# Patient Record
Sex: Female | Born: 1995 | Race: White | Hispanic: No | Marital: Single | Smoking: Never smoker
Health system: Southern US, Community
[De-identification: ages and names within clinical notes are randomized; demographics above are authoritative.]

## PROBLEM LIST (undated history)

## (undated) HISTORY — PX: HERNIA REPAIR: SHX51

---

## 2018-01-25 ENCOUNTER — Ambulatory Visit (HOSPITAL_COMMUNITY)
Admission: EM | Admit: 2018-01-25 | Discharge: 2018-01-25 | Disposition: A | Discharge status: Home / Self Care | Attending: Family Medicine | Admitting: Family Medicine

## 2018-01-25 ENCOUNTER — Encounter (HOSPITAL_COMMUNITY): Payer: Self-pay | Admitting: Emergency Medicine

## 2018-01-25 DIAGNOSIS — R0981 Nasal congestion: Secondary | ICD-10-CM | POA: Diagnosis present

## 2018-01-25 DIAGNOSIS — J029 Acute pharyngitis, unspecified: Secondary | ICD-10-CM | POA: Diagnosis present

## 2018-01-25 DIAGNOSIS — R05 Cough: Secondary | ICD-10-CM | POA: Diagnosis present

## 2018-01-25 DIAGNOSIS — R6889 Other general symptoms and signs: Secondary | ICD-10-CM | POA: Diagnosis not present

## 2018-01-25 DIAGNOSIS — J020 Streptococcal pharyngitis: Secondary | ICD-10-CM | POA: Diagnosis not present

## 2018-01-25 LAB — POCT RAPID STREP A: Streptococcus, Group A Screen (Direct): NEGATIVE

## 2018-01-25 MED ORDER — IPRATROPIUM BROMIDE 0.06 % NA SOLN: 2.0000 | Freq: Four times a day (QID) | NASAL | 0 refills | Status: AC

## 2018-01-25 MED ORDER — FLUTICASONE PROPIONATE 50 MCG/ACT NA SUSP: 2.0000 | Freq: Every day | NASAL | 0 refills | Status: AC

## 2018-01-25 MED ORDER — BENZONATATE 100 MG PO CAPS: 100.0000 mg | ORAL_CAPSULE | Freq: Three times a day (TID) | ORAL | 0 refills | Status: AC

## 2018-01-25 MED ORDER — ACETAMINOPHEN 325 MG PO TABS
ORAL_TABLET | ORAL | Status: AC
  Filled 2018-01-25: qty 2

## 2018-01-25 MED ORDER — ONDANSETRON 4 MG PO TBDP: 4.0000 mg | ORAL_TABLET | Freq: Three times a day (TID) | ORAL | 0 refills | Status: AC | PRN

## 2018-01-25 MED ORDER — ACETAMINOPHEN 325 MG PO TABS
650.0000 mg | ORAL_TABLET | Freq: Once | ORAL | Status: AC
  Administered 2018-01-25: 650 mg via ORAL

## 2018-01-25 NOTE — ED Triage Notes (Signed)
PT reports cough, congestion, sore throat, fever, headaches, and body aches. PT reports nausea and diarrhea as well. Everything started Sudn[place].

## 2018-01-25 NOTE — ED Provider Notes (Signed)
[facility]    CSN: [id number] Arrival date & time: 01/25/18  1646     History   Chief Complaint Chief Complaint  Patient presents with  . Influenza    HPI Sara Solis is a 22 y.o. female.   22 year old female comes in with 3-day history of flulike symptoms.  Has a cough, rhinorrhea, nasal congestion, sore throat, headache, body ache.  T-max 102, Tylenol/NyQuil, last dose last night.  Has had nausea without vomiting.  Has had 5 episodes of loose stools without blood.  OTC lozenges without relief.  Never smoker.  No sick contact.  No flu shot.      History reviewed. No pertinent past medical history.  There are no active problems to display for this patient.   Past Surgical History:  Procedure Laterality Date  . HERNIA REPAIR      OB History    No data available       Home Medications    Prior to Admission medications   Medication Sig Start Date End Date Taking? Authorizing Provider  UNKNOWN TO PATIENT    Yes [provider]  benzonatate (TESSALON) 100 MG capsule Take 1 capsule (100 mg total) by mouth every 8 (eight) hours. 01/25/18   Cathie HoopsYu, Amy V, PA-C  fluticasone (FLONASE) 50 MCG/ACT nasal spray Place 2 sprays into both nostrils daily. 01/25/18   Cathie HoopsYu, Amy V, PA-C  ipratropium (ATROVENT) 0.06 % nasal spray Place 2 sprays into both nostrils 4 (four) times daily. 01/25/18   Cathie HoopsYu, Amy V, PA-C  ondansetron (ZOFRAN ODT) 4 MG disintegrating tablet Take 1 tablet (4 mg total) by mouth every 8 (eight) hours as needed for nausea or vomiting. 01/25/18   Belinda FisherYu, Amy V, PA-C    Family History No family history on file.  Social History Social History   Tobacco Use  . Smoking status: Not on file  Substance Use Topics  . Alcohol use: Not on file  . Drug use: Not on file     Allergies   Patient has no known allergies.   Review of Systems Review of Systems  Reason unable to perform ROS: See HPI as above.     Physical Exam Triage Vital Signs ED  Triage Vitals [01/25/18 1815]  Enc Vitals Group     BP 129/84     Pulse Rate 92     Resp 16     Temp (!) 100.9 F (38.3 C)     Temp Source Temporal     SpO2 100 %     Weight 160 lb (72.6 kg)     Height 5\' 10"  (1.778 m)     Head Circumference      Peak Flow      Pain Score 4     Pain Loc      Pain Edu?      Excl. in GC?    No data found.  Updated Vital Signs BP 129/84   Pulse 92   Temp (!) 100.9 F (38.3 C) (Temporal)   Resp 16   Ht 5\' 10"  (1.778 m)   Wt 160 lb (72.6 kg)   LMP 12/28/2017   SpO2 100%   BMI 22.96 kg/m   Physical Exam  Constitutional: She is oriented to person, place, and time. She appears well-developed and well-nourished. No distress.  HENT:  Head: Normocephalic and atraumatic.  Right Ear: External ear and ear canal normal. Tympanic membrane is erythematous. Tympanic membrane is not bulging.  Left Ear: External ear  and ear canal normal. Tympanic membrane is erythematous. Tympanic membrane is not bulging.  Nose: Mucosal edema and rhinorrhea present. Right sinus exhibits no maxillary sinus tenderness and no frontal sinus tenderness. Left sinus exhibits no maxillary sinus tenderness and no frontal sinus tenderness.  Mouth/Throat: Uvula is midline and mucous membranes are normal. Posterior oropharyngeal erythema present. No tonsillar exudate.  Eyes: Conjunctivae are normal. Pupils are equal, round, and reactive to light.  Neck: Normal range of motion. Neck supple.  Cardiovascular: Normal rate, regular rhythm and normal heart sounds. Exam reveals no gallop and no friction rub.  No murmur heard. Pulmonary/Chest: Effort normal and breath sounds normal. She has no decreased breath sounds. She has no wheezes. She has no rhonchi. She has no rales.  Lymphadenopathy:    She has no cervical adenopathy.  Neurological: She is alert and oriented to person, place, and time.  Skin: Skin is warm and dry.  Psychiatric: She has a normal mood and affect. Her behavior is  normal. Judgment normal.     UC Treatments / Results  Labs (all labs ordered are listed, but only abnormal results are displayed) Labs Reviewed  CULTURE, GROUP A STREP [facility])  POCT RAPID STREP A    EKG  EKG Interpretation None       Radiology No results found.  Procedures Procedures (including critical care time)  Medications Ordered in UC Medications  acetaminophen (TYLENOL) tablet 650 mg (650 mg Oral Given 01/25/18 1836)     Initial Impression / Assessment and Plan / UC Course  I have reviewed the triage vital signs and the nursing notes.  Pertinent labs & imaging results that were available during my care of the patient were reviewed by me and considered in my medical decision making (see chart for details).    Rapid strep negative. Patient nontoxic in appearance. Discussed possible flu causing symptoms. Patient outside of Tamiflu range.  We will treat symptomatically.  Push fluids.  Return precautions given.  Final Clinical Impressions(s) / UC Diagnoses   Final diagnoses:  Flu-like symptoms    ED Discharge Orders        Ordered    ondansetron (ZOFRAN ODT) 4 MG disintegrating tablet  Every 8 hours PRN     01/25/18 1918    fluticasone (FLONASE) 50 MCG/ACT nasal spray  Daily     01/25/18 1918    ipratropium (ATROVENT) 0.06 % nasal spray  4 times daily     01/25/18 1918    benzonatate (TESSALON) 100 MG capsule  Every 8 hours     01/25/18 1918        Belinda Fisher, PA-C 01/25/18 1927

## 2018-01-25 NOTE — Discharge Instructions (Signed)
Rapid strep negative. Symptoms most likely due to viral illness/flu. You are outside of treatment range for flu medicine, so wil treat you symptomatically. Tessalon for cough. Start flonase, atrovent nasal spray for nasal congestion/drainage. You can use over the counter nasal saline rinse such as neti pot for nasal congestion. Keep hydrated, your urine should be clear to pale yellow in color. Tylenol/motrin for fever and pain. Zofran for nausea and vomiting as needed. Bland diet, advance as tolerated. Probiotics after diarrhea resolves. Monitor for any worsening of symptoms, nausea or vomiting not controlled by medication, worsening abdominal pain, fever, follow-up for reevaluation. Monitor for any worsening of symptoms, chest pain, shortness of breath, wheezing, swelling of the throat, follow up for reevaluation.   For sore throat try using a honey-based tea. Use 3 teaspoons of honey with juice squeezed from half lemon. Place shaved pieces of ginger into 1/2-1 cup of water and warm over stove top. Then mix the ingredients and repeat every 4 hours as needed.

## 2018-01-28 LAB — CULTURE, GROUP A STREP (THRC)

## 2018-01-31 ENCOUNTER — Telehealth (HOSPITAL_COMMUNITY): Payer: Self-pay | Admitting: *Deleted

## 2018-01-31 NOTE — Telephone Encounter (Signed)
-----   Message from Isa RankinLaura Wilson Murray, MD sent at 01/28/2018  9:00 PM EDT ----- Clinical staff, please let patient know that throat culture was positive for non group A Strep germ.  This is a finding of uncertain significance; not the typical 'strep throat' germ.  If the patient is having severe/persistent sore throat, and/or fever >100.5, ok to send rx for penicillin V 500mg  bid x 10d #20 no refills.   Recheck for further evaluation if symptoms are not improving.  LM

## 2018-01-31 NOTE — Telephone Encounter (Signed)
Pt called regarding test results of recent visit.  No answer at this time. Message left for patient to return call at their convenience.

## 2018-01-31 NOTE — Telephone Encounter (Signed)
Pt returned call regarding results. Denies any symptoms at this time. Encouraged for patient to be seen is symptoms come back.

## 2018-07-22 ENCOUNTER — Emergency Department (HOSPITAL_COMMUNITY)
Admission: EM | Admit: 2018-07-22 | Discharge: 2018-07-22 | Disposition: A | Discharge status: Home / Self Care | Attending: Emergency Medicine | Admitting: Emergency Medicine

## 2018-07-22 ENCOUNTER — Other Ambulatory Visit: Payer: Self-pay

## 2018-07-22 ENCOUNTER — Emergency Department (HOSPITAL_COMMUNITY)

## 2018-07-22 ENCOUNTER — Encounter (HOSPITAL_COMMUNITY): Payer: Self-pay | Admitting: *Deleted

## 2018-07-22 DIAGNOSIS — Z23 Encounter for immunization: Secondary | ICD-10-CM | POA: Diagnosis not present

## 2018-07-22 DIAGNOSIS — S80812A Abrasion, left lower leg, initial encounter: Secondary | ICD-10-CM | POA: Diagnosis not present

## 2018-07-22 DIAGNOSIS — W1789XA Other fall from one level to another, initial encounter: Secondary | ICD-10-CM | POA: Insufficient documentation

## 2018-07-22 DIAGNOSIS — Z79899 Other long term (current) drug therapy: Secondary | ICD-10-CM | POA: Diagnosis not present

## 2018-07-22 DIAGNOSIS — S81012A Laceration without foreign body, left knee, initial encounter: Secondary | ICD-10-CM | POA: Insufficient documentation

## 2018-07-22 DIAGNOSIS — Y998 Other external cause status: Secondary | ICD-10-CM | POA: Insufficient documentation

## 2018-07-22 DIAGNOSIS — Y92828 Other wilderness area as the place of occurrence of the external cause: Secondary | ICD-10-CM | POA: Insufficient documentation

## 2018-07-22 DIAGNOSIS — Y9331 Activity, mountain climbing, rock climbing and wall climbing: Secondary | ICD-10-CM | POA: Diagnosis not present

## 2018-07-22 MED ORDER — TETANUS-DIPHTH-ACELL PERTUSSIS 5-2.5-18.5 LF-MCG/0.5 IM SUSP
0.5000 mL | Freq: Once | INTRAMUSCULAR | Status: AC
  Administered 2018-07-22: 0.5 mL via INTRAMUSCULAR
  Filled 2018-07-22: qty 0.5

## 2018-07-22 MED ORDER — LIDOCAINE HCL (PF) 1 % IJ SOLN
10.0000 mL | Freq: Once | INTRAMUSCULAR | Status: AC
  Administered 2018-07-22: 10 mL
  Filled 2018-07-22: qty 30

## 2018-07-22 MED ORDER — LIDOCAINE-EPINEPHRINE-TETRACAINE (LET) SOLUTION
3.0000 mL | Freq: Once | NASAL | Status: AC
  Administered 2018-07-22: 3 mL via TOPICAL
  Filled 2018-07-22: qty 3

## 2018-07-22 NOTE — Discharge Instructions (Signed)
Please see the information and instructions below regarding your visit.  Your diagnoses today include:  1. Laceration of left knee, initial encounter   2. Abrasion of left lower extremity, initial encounter     Tests performed today include: X-ray of the affected area that did not show any foreign bodies or broken bones Vital signs. See below for your results today.   Medications prescribed:   Take any prescribed medications only as directed.  You may take 400-600mg  ibuprofen every 6 hours as needed for pain. If still requiring this medication around the clock for acute pain after 10 days, please see your primary healthcare provider.  Women who are pregnant, breastfeeding, or planning on becoming pregnant should not take non-steroidal anti-inflammatories such as Advil and Aleve. Tylenol is a safe over the counter pain reliever in pregnant women.  You may combine this medication with Tylenol, 650 mg every 6 hours, so you are receiving something for pain every 3 hours.  This is not a long-term medication unless under the care and direction of your primary provider. Taking this medication long-term and not under the supervision of a healthcare provider could increase the risk of stomach ulcers, kidney problems, and cardiovascular problems such as high blood pressure.    Home care instructions:  Follow any educational materials and wound care instructions contained in this packet.   Keep affected area above the level of your heart when possible to minimize swelling. Wash area gently twice a day with warm soapy water. Do not apply alcohol or hydrogen peroxide directly over a wound. Cover the area if it is draining or weeping. Keep the bandage in place for 24 hours and refrain from getting the wound wet for 24 hours. After that, you may get the area wet, but please ensure that you dry it completely afterwards.  Please refrain from soaking sutures for long periods of time, or swimming in  chlorinated water   You may apply antibiotic ointment such as Bacitracin or Neosporin after 24 hours.  Follow-up instructions: Suture Removal: Return to the urgent care or see your primary care care doctor in 7 days for a recheck of your wound and removal of your sutures or staples.    Return instructions:  Return to the Emergency Department if you have: Fever Worsening pain Worsening swelling of the wound Pus draining from the wound Redness of the skin that moves away from the wound, especially if it streaks away from the affected area  Any other emergent concerns  Your vital signs today were: BP (!) 148/88    Pulse 86    Temp 98.9 F (37.2 C) (Oral)    Resp 18    Ht 5\' 10"  (1.778 m)    Wt 72.6 kg    LMP 06/26/2018    SpO2 96%    BMI 22.96 kg/m  If your blood pressure (BP) was elevated on multiple readings during this visit above 130 for the top number or above 80 for the bottom number, please have this repeated by your primary care provider within one month. --------------  Thank you for allowing [place] to participate in your care today! It was a pleasure taking care of you.

## 2018-07-22 NOTE — ED Provider Notes (Signed)
[facility]-EMERGENCY DEPT Provider Note   CSN: [id number] Arrival date & time: 07/22/18  1912     History   Chief Complaint Chief Complaint  Patient presents with  . Laceration    HPI Sara Solis is a 22 y.o. female.  HPI  Patient is a 22 year old female with no significant past medical history presenting for laceration just distal to the left knee as well as abrasion surrounding laceration.  Patient reports that she was hiking earlier today and was climbing a waterfall when she slipped and caused laceration to the left knee.  Patient reports that occurred approximately 4 hours ago.  Patient reports that it has continued to bleed.  Patient denies any weakness or numbness distal to the injury.  Patient denies any difficulty with range of motion of her knee, or bony tenderness of the knee.  Patient reports she was walking on the knee before coming to the emergency department.  Tetanus shot unknown.  History reviewed. No pertinent past medical history.  There are no active problems to display for this patient.   Past Surgical History:  Procedure Laterality Date  . HERNIA REPAIR       OB History   None      Home Medications    Prior to Admission medications   Medication Sig Start Date End Date Taking? Authorizing Provider  benzonatate (TESSALON) 100 MG capsule Take 1 capsule (100 mg total) by mouth every 8 (eight) hours. 01/25/18   Cathie Hoops, Amy V, PA-C  fluticasone (FLONASE) 50 MCG/ACT nasal spray Place 2 sprays into both nostrils daily. 01/25/18   Cathie Hoops, Amy V, PA-C  ipratropium (ATROVENT) 0.06 % nasal spray Place 2 sprays into both nostrils 4 (four) times daily. 01/25/18   Cathie Hoops, Amy V, PA-C  ondansetron (ZOFRAN ODT) 4 MG disintegrating tablet Take 1 tablet (4 mg total) by mouth every 8 (eight) hours as needed for nausea or vomiting. 01/25/18   Belinda Fisher, PA-C  UNKNOWN TO PATIENT     [provider]    Family History No family history on  file.  Social History Social History   Tobacco Use  . Smoking status: Never Smoker  Substance Use Topics  . Alcohol use: Never    Frequency: Never  . Drug use: Never     Allergies   Patient has no known allergies.   Review of Systems Review of Systems  Musculoskeletal: Positive for arthralgias. Negative for joint swelling.  Skin: Positive for wound. Negative for color change.  Neurological: Negative for weakness and numbness.     Physical Exam Updated Vital Signs BP (!) 148/87   Pulse 91   Temp 98.9 F (37.2 C) (Oral)   Resp 16   Ht 5\' 10"  (1.778 m)   Wt 72.6 kg   LMP 06/26/2018   SpO2 100%   BMI 22.96 kg/m   Physical Exam  Constitutional: She appears well-developed and well-nourished. No distress.  Sitting comfortably in bed.  HENT:  Head: Normocephalic and atraumatic.  Eyes: Conjunctivae are normal. Right eye exhibits no discharge. Left eye exhibits no discharge.  EOMs normal to gross examination.  Neck: Normal range of motion.  Cardiovascular: Normal rate and regular rhythm.  Intact, 2+ radial pulse.  Pulmonary/Chest:  Normal respiratory effort. Patient converses comfortably. No audible wheeze or stridor.  Abdominal: She exhibits no distension.  Musculoskeletal: Normal range of motion.  Left knee exam: Patient exhibits 1.5 cm laceration just distal to the patella, as well as small avulsion  medial to patella and surrounding abrasions of left knee.  Laceration is superficial and without any extension into the joint itself.  No bony tenderness of patella.  No joint line tenderness. No joint effusion or swelling appreciated. No abnormal alignment or patellar mobility. No bruising, erythema or warmth overlaying the joint. No varus/valgus laxity. Negative drawer's, Lachman's and McMurray's.  No crepitus.  2+ DP pulses bilaterally. All compartments are soft. Sensation intact distal to injury.  Neurological: She is alert.  Cranial nerves intact to gross  observation. Patient moves extremities without difficulty.  Skin: Skin is warm and dry. She is not diaphoretic.  Psychiatric: She has a normal mood and affect. Her behavior is normal. Judgment and thought content normal.  Nursing note and vitals reviewed.    ED Treatments / Results  Labs (all labs ordered are listed, but only abnormal results are displayed) Labs Reviewed - No data to display  EKG None  Radiology Dg Knee Complete 4 Views Left  Result Date: 07/22/2018 CLINICAL DATA:  Patient fell onto a rock and has abrasions with pain and swelling. EXAM: LEFT KNEE - COMPLETE 4+ VIEW COMPARISON:  None. FINDINGS: No evidence of fracture, dislocation, or joint effusion. No evidence of arthropathy or other focal bone abnormality. Minimal soft tissue debris and soft tissue swelling anterior to the expected course of the patellar tendon. IMPRESSION: Mild soft tissue swelling anterior to the patellar tendon with punctate soft tissue debris compatible with history of soft tissue abrasion. No acute fracture or malalignment. Electronically Signed   By: Tollie Eth M.D.   On: 07/22/2018 20:02    Procedures .Marland KitchenLaceration Repair Date/Time: 07/22/2018 10:36 PM Performed by: Elisha Ponder, PA-C Authorized by: Elisha Ponder, PA-C   Consent:    Consent obtained:  Verbal   Consent given by:  Patient   Risks discussed:  Infection, pain and poor cosmetic result Anesthesia (see MAR for exact dosages):    Anesthesia method:  Local infiltration and topical application   Topical anesthetic:  LET   Local anesthetic:  Lidocaine 1% w/o epi Laceration details:    Location:  Leg   Leg location:  L knee   Length (cm):  1.5 Repair type:    Repair type:  Simple Pre-procedure details:    Preparation:  Patient was prepped and draped in usual sterile fashion Exploration:    Hemostasis achieved with:  Direct pressure   Wound exploration: wound explored through full range of motion     Contaminated: no    Treatment:    Area cleansed with:  Saline   Amount of cleaning:  Standard   Irrigation solution:  Sterile saline   Irrigation volume:  200 ml   Irrigation method:  Syringe Skin repair:    Repair method:  Sutures   Suture size:  4-0   Suture material:  Nylon   Suture technique:  Simple interrupted   Number of sutures:  3 Approximation:    Approximation:  Close Post-procedure details:    Dressing:  Non-adherent dressing   Patient tolerance of procedure:  Tolerated well, no immediate complications   (including critical care time)  Medications Ordered in ED Medications  Tdap (BOOSTRIX) injection 0.5 mL (0.5 mLs Intramuscular Given 07/22/18 2017)  lidocaine-EPINEPHrine-tetracaine (LET) solution (3 mLs Topical Given 07/22/18 2017)  lidocaine (PF) (XYLOCAINE) 1 % injection 10 mL (10 mLs Infiltration Given 07/22/18 2017)     Initial Impression / Assessment and Plan / ED Course  I have reviewed the triage vital signs  and the nursing notes.  Pertinent labs & imaging results that were available during my care of the patient were reviewed by me and considered in my medical decision making (see chart for details).  Clinical Course as of Jul 22 2229  Fri Jul 22, 2018  2225 Noted to pt. Pt reports upcoming physical in October with PCP where it will rechecked at that time.  BP(!): 148/87 [AM]    Clinical Course User Index [AM] Elisha Ponder, PA-C    Patient well-appearing and neurovascularly intact in the left lower extremity.  Patient with superficial laceration just distal to the knee joint.  No joint involvement.  No bony tenderness.  Laceration repaired with primary closure.  Instructed patient on follow-up for suture removal in 7 days, as well as signs and symptoms of infection including erythema, drainage, or swelling.  Tetanus shot updated today.  Patient is in understanding and agrees with the plan of care.  Final Clinical Impressions(s) / ED Diagnoses   Final diagnoses:   Laceration of left knee, initial encounter  Abrasion of left lower extremity, initial encounter    ED Discharge Orders    None       Delia Chimes 07/22/18 2239    Pricilla Loveless, MD 07/22/18 [phone]

## 2018-07-22 NOTE — ED Triage Notes (Signed)
Pt was at a waterfall and fell down. Lac and abrasion to the left knee area. Abrasion to the top of the left foot

## 2018-07-22 NOTE — ED Notes (Signed)
Pt states that she thinks that her BP is still elevated d/t being anxious and pain. She also states that she is in a rush to make it to [place]. She reports that she has a PCP appointment in October and will have it rechecked then.

## 2018-07-22 NOTE — ED Notes (Signed)
Wound flushed with saline.

## 2018-07-29 ENCOUNTER — Ambulatory Visit (HOSPITAL_COMMUNITY): Admission: EM | Admit: 2018-07-29 | Discharge: 2018-07-29 | Disposition: A | Discharge status: Home / Self Care

## 2018-07-29 DIAGNOSIS — Z4802 Encounter for removal of sutures: Secondary | ICD-10-CM | POA: Diagnosis not present

## 2018-07-29 DIAGNOSIS — S81012D Laceration without foreign body, left knee, subsequent encounter: Secondary | ICD-10-CM

## 2018-07-29 NOTE — ED Notes (Signed)
Pt presented to have 3 sutures removed from left knee.

## 2019-02-24 IMAGING — DX DG KNEE COMPLETE 4+V*L*
4 series · 5 of 5 positions shown · non-contrast
Comparison: None.

CLINICAL DATA: Patient fell onto a rock and has abrasions with pain
and swelling.

EXAM:
LEFT KNEE - COMPLETE 4+ VIEW

[knee ap]
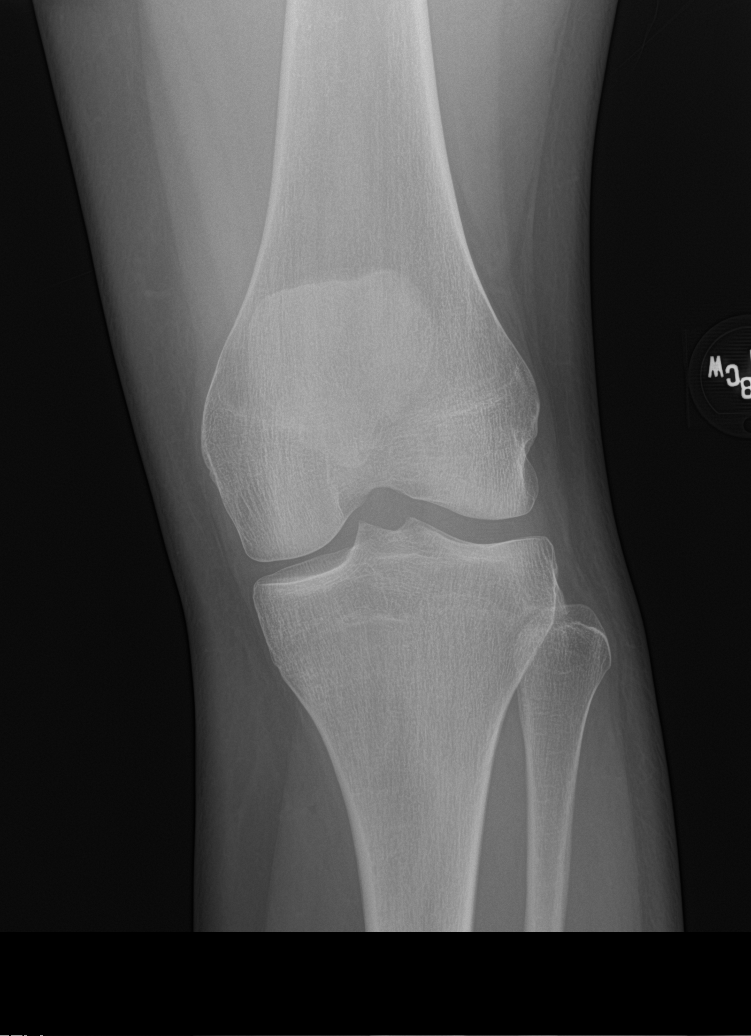

[Series 3: knee lat · 0.14mm/px · 2 of 2 slices shown]
[im 1/2]
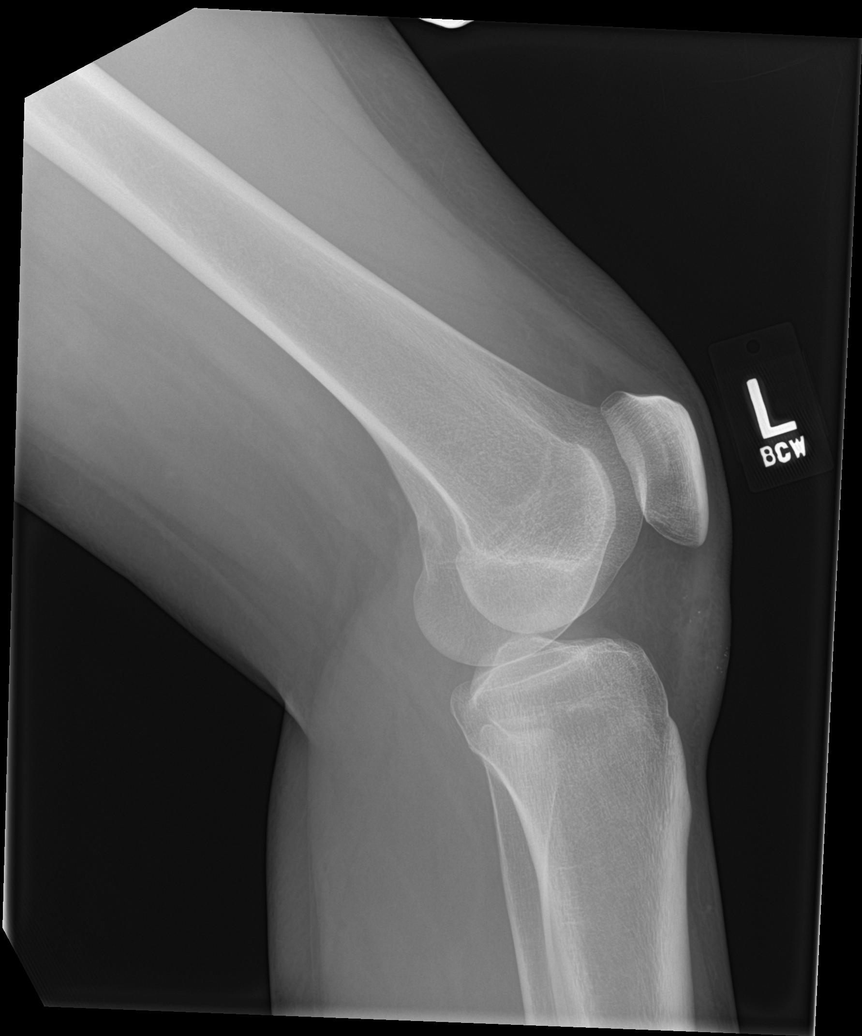
[im 2/2]
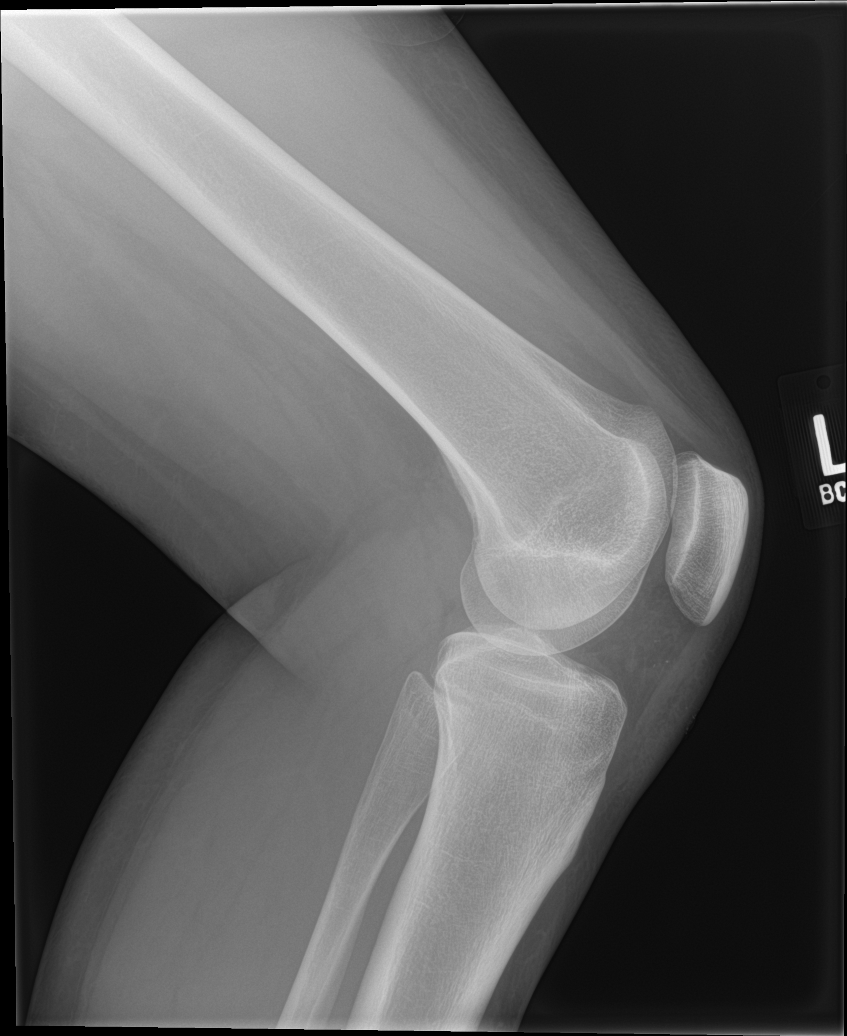

[knee obl (1 of 2)]
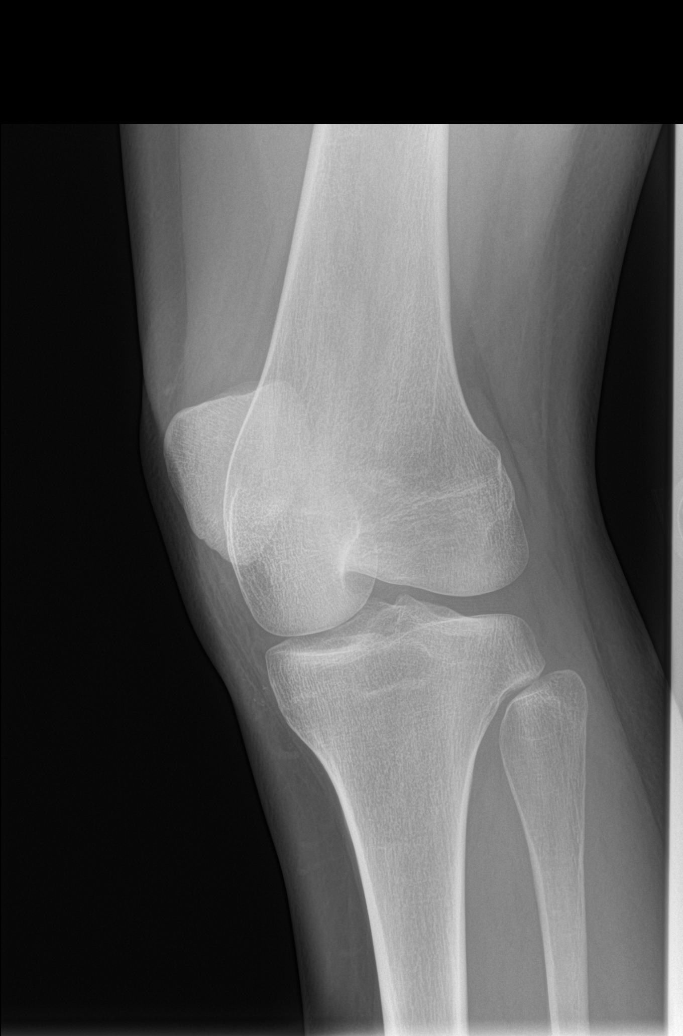

[knee obl (2 of 2)]
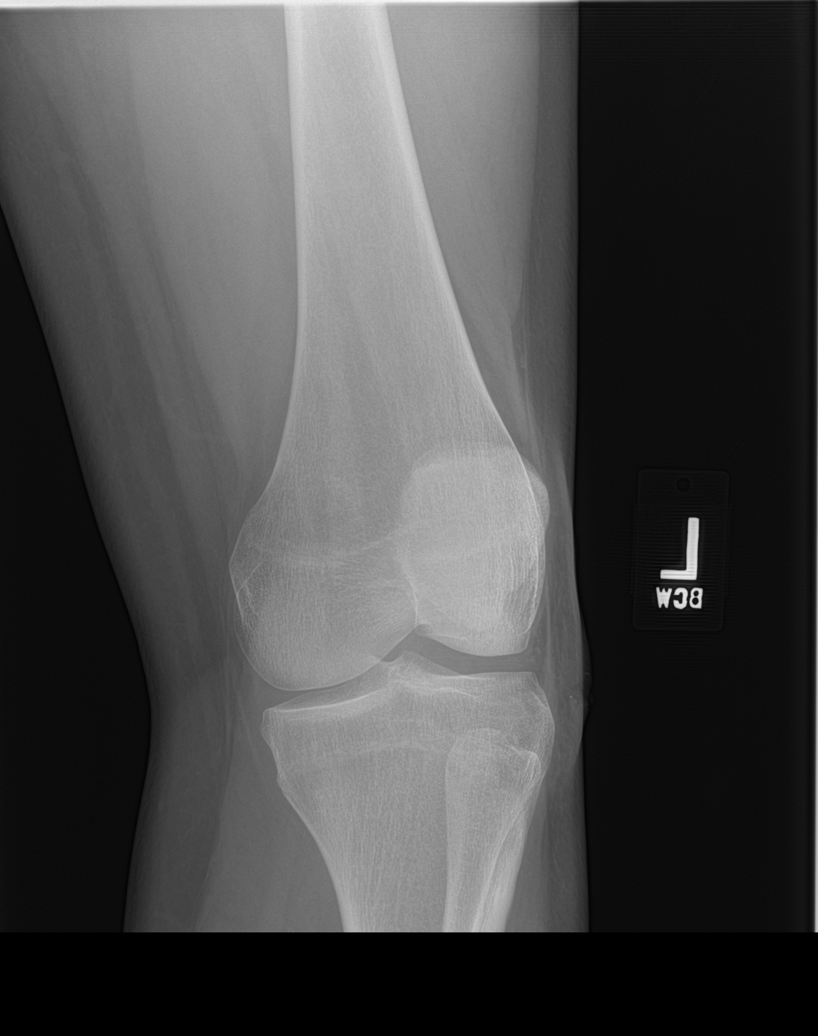

[5 of 5 positions shown; findings below may reference images not displayed]

FINDINGS: No evidence of fracture, dislocation, or joint effusion. No evidence
of arthropathy or other focal bone abnormality. Minimal soft tissue
debris and soft tissue swelling anterior to the expected course of
the patellar tendon.
IMPRESSION: Mild soft tissue swelling anterior to the patellar tendon with
punctate soft tissue debris compatible with history of soft tissue
abrasion. No acute fracture or malalignment.
